# Patient Record
Sex: Male | Born: 1960
Health system: Southern US, Community
[De-identification: ages and names within clinical notes are randomized; demographics above are authoritative.]

## PROBLEM LIST (undated history)

## (undated) DIAGNOSIS — K219 Gastro-esophageal reflux disease without esophagitis: Secondary | ICD-10-CM

## (undated) DIAGNOSIS — E785 Hyperlipidemia, unspecified: Secondary | ICD-10-CM

## (undated) HISTORY — DX: Hyperlipidemia, unspecified: E78.5

## (undated) HISTORY — DX: Gastro-esophageal reflux disease without esophagitis: K21.9

---

## 2002-10-26 ENCOUNTER — Encounter: Payer: Self-pay | Admitting: Emergency Medicine

## 2002-10-26 ENCOUNTER — Emergency Department (HOSPITAL_COMMUNITY): Admission: EM | Admit: 2002-10-26 | Discharge: 2002-10-27 | Payer: Self-pay | Admitting: Emergency Medicine

## 2003-03-16 ENCOUNTER — Ambulatory Visit (HOSPITAL_COMMUNITY): Admission: RE | Admit: 2003-03-16 | Discharge: 2003-03-16 | Payer: Self-pay | Admitting: Orthopedic Surgery

## 2003-03-20 ENCOUNTER — Encounter: Payer: Self-pay | Admitting: Orthopedic Surgery

## 2003-03-20 ENCOUNTER — Ambulatory Visit (HOSPITAL_COMMUNITY): Admission: RE | Admit: 2003-03-20 | Discharge: 2003-03-20 | Payer: Self-pay | Admitting: Orthopedic Surgery

## 2012-12-20 ENCOUNTER — Encounter (INDEPENDENT_AMBULATORY_CARE_PROVIDER_SITE_OTHER): Payer: Self-pay

## 2012-12-20 ENCOUNTER — Encounter (INDEPENDENT_AMBULATORY_CARE_PROVIDER_SITE_OTHER): Payer: Self-pay | Admitting: Surgery

## 2012-12-24 ENCOUNTER — Ambulatory Visit (INDEPENDENT_AMBULATORY_CARE_PROVIDER_SITE_OTHER): Payer: Federal, State, Local not specified - PPO | Admitting: Surgery

## 2012-12-24 ENCOUNTER — Encounter (INDEPENDENT_AMBULATORY_CARE_PROVIDER_SITE_OTHER): Payer: Self-pay | Admitting: Surgery

## 2012-12-24 VITALS — BP 114/70 | HR 64 | Temp 97.1°F | Resp 12 | Ht 72.0 in | Wt 175.8 lb

## 2012-12-24 DIAGNOSIS — K4090 Unilateral inguinal hernia, without obstruction or gangrene, not specified as recurrent: Secondary | ICD-10-CM

## 2012-12-24 NOTE — Patient Instructions (Signed)

## 2012-12-24 NOTE — Progress Notes (Signed)
Patient ID: Bruce [name], male   DOB: [birth year], 52 y.o.   MRN: [id number]  No chief complaint on file.   HPI Bruce [name] is a 53 y.o. male.  Patient sent at the request of Dr. Gildardo Cranker for right inguinal hernia. This hernia was detected when he was a small child. It is not causing any significant pain at this point in time. Denies any gastrointestinal symptoms. He is very active and this has not interfered with his activity. HPI  Past Medical History  Diagnosis Date  . Hyperlipidemia   . GERD (gastroesophageal reflux disease)     History reviewed. No pertinent past surgical history.  Family History  Problem Relation Age of Onset  . Cancer Mother   . Heart disease Father     Social History History  Substance Use Topics  . Smoking status: Never Smoker   . Smokeless tobacco: Not on file  . Alcohol Use: Yes    No Known Allergies  Current Outpatient Prescriptions  Medication Sig Dispense Refill  . b complex vitamins tablet Take 1 tablet by mouth daily.      . fish oil-omega-3 fatty acids 1000 MG capsule Take 2 g by mouth daily.      . Multiple Vitamins-Minerals (MULTIVITAMIN WITH MINERALS) tablet Take 1 tablet by mouth daily.      Marland Kitchen omeprazole (PRILOSEC) 20 MG capsule Take 20 mg by mouth daily.      . pramipexole (MIRAPEX) 0.25 MG tablet Take 0.25 mg by mouth 3 (three) times daily.       No current facility-administered medications for this visit.    Review of Systems Review of Systems  Constitutional: Negative for fever, chills and unexpected weight change.  HENT: Negative for hearing loss, congestion, sore throat, trouble swallowing and voice change.   Eyes: Negative for visual disturbance.  Respiratory: Negative for cough and wheezing.   Cardiovascular: Negative for chest pain, palpitations and leg swelling.  Gastrointestinal: Negative for nausea, vomiting, abdominal pain, diarrhea, constipation, blood in stool, abdominal distention, anal bleeding and rectal  pain.  Genitourinary: Negative for hematuria and difficulty urinating.  Musculoskeletal: Negative for arthralgias.  Skin: Negative for rash and wound.  Neurological: Negative for seizures, syncope, weakness and headaches.  Hematological: Negative for adenopathy. Does not bruise/bleed easily.  Psychiatric/Behavioral: Negative for confusion.    Blood pressure 114/70, pulse 64, temperature 97.1 F (36.2 C), resp. rate 12, height 6' (1.829 m), weight 175 lb 12.8 oz (79.742 kg).  Physical Exam Physical Exam  Constitutional: He is oriented to person, place, and time. He appears well-developed and well-nourished.  HENT:  Head: Normocephalic and atraumatic.  Eyes: Conjunctivae and EOM are normal. Pupils are equal, round, and reactive to light.  Neck: Neck supple.  Pulmonary/Chest: Effort normal.  Abdominal: Soft. Normal appearance. There is no tenderness. A hernia is present. Hernia confirmed positive in the right inguinal area. Hernia confirmed negative in the ventral area and confirmed negative in the left inguinal area.  Musculoskeletal: Normal range of motion.  Neurological: He is alert and oriented to person, place, and time.  Skin: Skin is warm and dry.  Psychiatric: He has a normal mood and affect. His behavior is normal. Judgment and thought content normal.      Assessment    Right inguinal hernia asymptomatic reducible    Plan    Discussed options of repair versus observation. He is quite active and is having minimal symptoms from this currently. He has had this for at  least 30 years by history. He would return later this year to scheduled at a later time. Information given.       Kizzie Cotten A. 12/24/2012, 3:27 PM

## 2013-04-01 ENCOUNTER — Encounter: Payer: Self-pay | Admitting: Family Medicine

## 2013-10-15 ENCOUNTER — Encounter: Payer: Self-pay | Admitting: Family Medicine

## 2013-10-15 ENCOUNTER — Encounter (INDEPENDENT_AMBULATORY_CARE_PROVIDER_SITE_OTHER): Payer: Self-pay

## 2013-10-15 ENCOUNTER — Ambulatory Visit (INDEPENDENT_AMBULATORY_CARE_PROVIDER_SITE_OTHER): Payer: BC Managed Care – PPO | Admitting: Family Medicine

## 2013-10-15 VITALS — BP 135/88 | HR 50 | Ht 72.0 in | Wt 175.0 lb

## 2013-10-15 DIAGNOSIS — M25511 Pain in right shoulder: Secondary | ICD-10-CM

## 2013-10-15 DIAGNOSIS — M25519 Pain in unspecified shoulder: Secondary | ICD-10-CM

## 2013-10-15 NOTE — Patient Instructions (Signed)
You have biceps tendinopathy. Consider formal physical therapy. Start home exercises - arm curls, supination/pronation (with hammer or weight) 3 sets of 10 once a day for next 6 weeks. Theraband exercises 3 sets of 10 once a day also for rotator cuff. Icing as needed 15 minutes at a time. Ibuprofen 600mg  three times a day with food OR aleve 2 tabs twice a day with food for pain and inflammation. Follow up with me in 6 weeks or as needed.

## 2013-10-20 ENCOUNTER — Encounter: Payer: Self-pay | Admitting: Family Medicine

## 2013-10-20 DIAGNOSIS — M25511 Pain in right shoulder: Secondary | ICD-10-CM | POA: Insufficient documentation

## 2013-10-20 NOTE — Assessment & Plan Note (Signed)
proximal biceps tendinopathy.  Start home exercise program, theraband exercises.  Icing, nsaids.  Consider formal physical therapy if not improving.  F/u in 6 weeks.

## 2013-10-20 NOTE — Progress Notes (Signed)
Patient ID: Bruce [name], male   DOB: Oct 16, 1960, 53 y.o.   MRN: [id number]  PCP:  Duane Lopeoss, Alan, MD  Subjective:   HPI: Patient is a 53 y.o. male here for right shoulder pain.  Patient denies known injury. Reports he works out at [employer] weightlifting. Also plays tennis. Some pain with overhead motions and when playing tennis. Forward pressing causes anterior shoulder pain. No swelling, bruising. Not currently taking or doing anything for this other than relative rest.  Past Medical History  Diagnosis Date  . Hyperlipidemia   . GERD (gastroesophageal reflux disease)     Current Outpatient Prescriptions on File Prior to Visit  Medication Sig Dispense Refill  . b complex vitamins tablet Take 1 tablet by mouth daily.      . fish oil-omega-3 fatty acids 1000 MG capsule Take 2 g by mouth daily.      . Multiple Vitamins-Minerals (MULTIVITAMIN WITH MINERALS) tablet Take 1 tablet by mouth daily.      Marland Kitchen. omeprazole (PRILOSEC) 20 MG capsule Take 20 mg by mouth daily.      . pramipexole (MIRAPEX) 0.25 MG tablet Take 0.25 mg by mouth 3 (three) times daily.       No current facility-administered medications on file prior to visit.    History reviewed. No pertinent past surgical history.  No Known Allergies  History   Social History  . Marital Status: Married    Spouse Name: N/A    Number of Children: N/A  . Years of Education: N/A   Occupational History  . Not on file.   Social History Main Topics  . Smoking status: Never Smoker   . Smokeless tobacco: Not on file  . Alcohol Use: Yes  . Drug Use: No  . Sexual Activity: Not on file   Other Topics Concern  . Not on file   Social History Narrative  . No narrative on file    Family History  Problem Relation Age of Onset  . Cancer Mother   . Heart disease Father     BP 135/88  Pulse 50  Ht 6' (1.829 m)  Wt 175 lb (79.379 kg)  BMI 23.73 kg/m2  Review of Systems: See HPI above.    Objective:  Physical Exam:  Gen:  NAD  Right shoulder: No swelling, ecchymoses.  No gross deformity. TTP prox biceps tendon.  No other tenderness. FROM with negative painful arc. Negative Hawkins, Neers. Mildly positive Yergasons. Strength 5/5 with empty can and resisted internal/external rotation. Negative apprehension. NV intact distally.    Assessment & Plan:  1. Right shoulder pain - proximal biceps tendinopathy.  Start home exercise program, theraband exercises.  Icing, nsaids.  Consider formal physical therapy if not improving.  F/u in 6 weeks.

## 2016-01-05 ENCOUNTER — Other Ambulatory Visit: Payer: Self-pay | Admitting: Physical Medicine and Rehabilitation

## 2016-01-05 DIAGNOSIS — M545 Low back pain: Secondary | ICD-10-CM

## 2016-01-10 ENCOUNTER — Ambulatory Visit
Admission: RE | Admit: 2016-01-10 | Discharge: 2016-01-10 | Disposition: A | Discharge status: Home / Self Care | Payer: Federal, State, Local not specified - PPO | Source: Ambulatory Visit | Attending: Physical Medicine and Rehabilitation | Admitting: Physical Medicine and Rehabilitation

## 2016-01-10 DIAGNOSIS — M545 Low back pain: Secondary | ICD-10-CM

## 2016-07-07 DIAGNOSIS — E782 Mixed hyperlipidemia: Secondary | ICD-10-CM | POA: Diagnosis not present

## 2016-07-07 DIAGNOSIS — M2550 Pain in unspecified joint: Secondary | ICD-10-CM | POA: Diagnosis not present

## 2016-07-22 DIAGNOSIS — E782 Mixed hyperlipidemia: Secondary | ICD-10-CM | POA: Diagnosis not present

## 2016-07-22 DIAGNOSIS — M2550 Pain in unspecified joint: Secondary | ICD-10-CM | POA: Diagnosis not present

## 2016-08-19 DIAGNOSIS — Z86010 Personal history of colonic polyps: Secondary | ICD-10-CM | POA: Diagnosis not present

## 2016-08-19 DIAGNOSIS — R197 Diarrhea, unspecified: Secondary | ICD-10-CM | POA: Diagnosis not present

## 2016-08-19 DIAGNOSIS — K219 Gastro-esophageal reflux disease without esophagitis: Secondary | ICD-10-CM | POA: Diagnosis not present

## 2016-08-22 DIAGNOSIS — R768 Other specified abnormal immunological findings in serum: Secondary | ICD-10-CM | POA: Diagnosis not present

## 2016-08-22 DIAGNOSIS — M2550 Pain in unspecified joint: Secondary | ICD-10-CM | POA: Diagnosis not present

## 2016-09-27 DIAGNOSIS — R197 Diarrhea, unspecified: Secondary | ICD-10-CM | POA: Diagnosis not present

## 2016-09-27 DIAGNOSIS — K228 Other specified diseases of esophagus: Secondary | ICD-10-CM | POA: Diagnosis not present

## 2016-09-27 DIAGNOSIS — K5730 Diverticulosis of large intestine without perforation or abscess without bleeding: Secondary | ICD-10-CM | POA: Diagnosis not present

## 2016-09-27 DIAGNOSIS — R194 Change in bowel habit: Secondary | ICD-10-CM | POA: Diagnosis not present

## 2016-09-27 DIAGNOSIS — Z86010 Personal history of colonic polyps: Secondary | ICD-10-CM | POA: Diagnosis not present

## 2016-09-27 DIAGNOSIS — K449 Diaphragmatic hernia without obstruction or gangrene: Secondary | ICD-10-CM | POA: Diagnosis not present

## 2016-09-27 DIAGNOSIS — Z8371 Family history of colonic polyps: Secondary | ICD-10-CM | POA: Diagnosis not present

## 2016-09-27 DIAGNOSIS — K219 Gastro-esophageal reflux disease without esophagitis: Secondary | ICD-10-CM | POA: Diagnosis not present

## 2016-09-29 DIAGNOSIS — K219 Gastro-esophageal reflux disease without esophagitis: Secondary | ICD-10-CM | POA: Diagnosis not present

## 2016-09-30 DIAGNOSIS — K080 Exfoliation of teeth due to systemic causes: Secondary | ICD-10-CM | POA: Diagnosis not present

## 2017-02-22 DIAGNOSIS — M67911 Unspecified disorder of synovium and tendon, right shoulder: Secondary | ICD-10-CM | POA: Diagnosis not present

## 2017-03-02 DIAGNOSIS — M25511 Pain in right shoulder: Secondary | ICD-10-CM | POA: Diagnosis not present

## 2017-03-06 DIAGNOSIS — H43813 Vitreous degeneration, bilateral: Secondary | ICD-10-CM | POA: Diagnosis not present

## 2017-03-16 DIAGNOSIS — K080 Exfoliation of teeth due to systemic causes: Secondary | ICD-10-CM | POA: Diagnosis not present

## 2017-06-01 DIAGNOSIS — M25511 Pain in right shoulder: Secondary | ICD-10-CM | POA: Diagnosis not present

## 2017-06-16 DIAGNOSIS — K080 Exfoliation of teeth due to systemic causes: Secondary | ICD-10-CM | POA: Diagnosis not present

## 2017-07-12 DIAGNOSIS — H6980 Other specified disorders of Eustachian tube, unspecified ear: Secondary | ICD-10-CM | POA: Diagnosis not present

## 2017-07-12 DIAGNOSIS — Z23 Encounter for immunization: Secondary | ICD-10-CM | POA: Diagnosis not present

## 2017-07-12 DIAGNOSIS — R42 Dizziness and giddiness: Secondary | ICD-10-CM | POA: Diagnosis not present

## 2017-07-17 DIAGNOSIS — K080 Exfoliation of teeth due to systemic causes: Secondary | ICD-10-CM | POA: Diagnosis not present

## 2017-08-04 DIAGNOSIS — Z125 Encounter for screening for malignant neoplasm of prostate: Secondary | ICD-10-CM | POA: Diagnosis not present

## 2017-08-04 DIAGNOSIS — Z13220 Encounter for screening for lipoid disorders: Secondary | ICD-10-CM | POA: Diagnosis not present

## 2017-08-04 DIAGNOSIS — Z23 Encounter for immunization: Secondary | ICD-10-CM | POA: Diagnosis not present

## 2017-08-04 DIAGNOSIS — Z0000 Encounter for general adult medical examination without abnormal findings: Secondary | ICD-10-CM | POA: Diagnosis not present

## 2017-11-22 DIAGNOSIS — K080 Exfoliation of teeth due to systemic causes: Secondary | ICD-10-CM | POA: Diagnosis not present

## 2017-11-24 DIAGNOSIS — R42 Dizziness and giddiness: Secondary | ICD-10-CM | POA: Diagnosis not present

## 2017-12-05 DIAGNOSIS — H8142 Vertigo of central origin, left ear: Secondary | ICD-10-CM | POA: Diagnosis not present

## 2017-12-05 DIAGNOSIS — H8110 Benign paroxysmal vertigo, unspecified ear: Secondary | ICD-10-CM | POA: Diagnosis not present

## 2017-12-19 DIAGNOSIS — J209 Acute bronchitis, unspecified: Secondary | ICD-10-CM | POA: Diagnosis not present

## 2018-01-10 DIAGNOSIS — R42 Dizziness and giddiness: Secondary | ICD-10-CM | POA: Diagnosis not present

## 2018-01-24 DIAGNOSIS — M67911 Unspecified disorder of synovium and tendon, right shoulder: Secondary | ICD-10-CM | POA: Diagnosis not present

## 2018-03-28 DIAGNOSIS — K080 Exfoliation of teeth due to systemic causes: Secondary | ICD-10-CM | POA: Diagnosis not present

## 2018-05-07 DIAGNOSIS — K080 Exfoliation of teeth due to systemic causes: Secondary | ICD-10-CM | POA: Diagnosis not present

## 2018-06-27 ENCOUNTER — Encounter: Payer: Self-pay | Admitting: Family Medicine

## 2018-06-27 ENCOUNTER — Ambulatory Visit: Payer: Federal, State, Local not specified - PPO | Admitting: Family Medicine

## 2018-06-27 VITALS — BP 154/80 | Ht 72.0 in | Wt 180.0 lb

## 2018-06-27 DIAGNOSIS — M25552 Pain in left hip: Secondary | ICD-10-CM | POA: Diagnosis not present

## 2018-06-27 NOTE — Patient Instructions (Signed)
Your pain is due to mild arthritis of your hip. You may have had muscular overuse strains in hamstring, quads too but your exam for this is normal now. These are the different medications you can take for this: Tylenol 500mg  1-2 tabs three times a day for pain. Capsaicin, aspercreme, or biofreeze topically up to four times a day may also help with pain. Some supplements that may help for arthritis: Boswellia extract, curcumin, pycnogenol Aleve 1-2 tabs twice a day with food - usually if you take, take 7-10 days then as needed. Cortisone injections are an option if pain is severe. It's important that you continue to stay active but avoid stretches we talked about like the pretzel stretch, the quad stretch on the chair for the next 4 weeks. Focus on strengthening with Straight leg raises, knee extensions, hip side raises, hamstring curls 3 sets of 10 once a day (add ankle weight if these become too easy). Consider physical therapy to strengthen muscles around the joint that hurts to take pressure off of the joint itself. Heat or ice 15 minutes at a time 3-4 times a day as needed to help with pain if needed. Swimming, cycling with low resistance are the best two types of exercise for arthritis though any exercise is ok as long as it doesn't worsen the pain. Follow up with me in 4 weeks.

## 2018-06-28 ENCOUNTER — Encounter: Payer: Self-pay | Admitting: Family Medicine

## 2018-06-28 DIAGNOSIS — H8112 Benign paroxysmal vertigo, left ear: Secondary | ICD-10-CM | POA: Insufficient documentation

## 2018-06-28 NOTE — Progress Notes (Signed)
PCP: Bruce Floro, MD  Subjective:   HPI: Patient is a 57 y.o. male here for left leg pain.  Patient is very active - plays hockey and exercises regularly. He recalls last Monday following playing golf, hockey, working out he developed pain that was fairly severe left hamstring. No acute injury or trauma. Pain has continued, worse later in the day and can involved his quad and most recently anterior hip, a tightness though hamstring was throbbing that first day. Has history of back problems with disc. Recalls recently had some pain outside left lower leg. Has tried stretching but no medications or anything else for this. No skin changes, numbness. No bowel/bladder dysfunction.  Past Medical History:  Diagnosis Date  . GERD (gastroesophageal reflux disease)   . Hyperlipidemia     Current Outpatient Medications on File Prior to Visit  Medication Sig Dispense Refill  . b complex vitamins tablet Take 1 tablet by mouth daily.    . fish oil-omega-3 fatty acids 1000 MG capsule Take 2 g by mouth daily.    . Multiple Vitamins-Minerals (MULTIVITAMIN WITH MINERALS) tablet Take 1 tablet by mouth daily.    Marland Kitchen omeprazole (PRILOSEC) 20 MG capsule Take 20 mg by mouth daily.    . pramipexole (MIRAPEX) 0.25 MG tablet Take 0.25 mg by mouth 3 (three) times daily.     No current facility-administered medications on file prior to visit.     History reviewed. No pertinent surgical history.  No Known Allergies  Social History   Socioeconomic History  . Marital status: Married    Spouse name: Not on file  . Number of children: Not on file  . Years of education: Not on file  . Highest education level: Not on file  Occupational History  . Not on file  Social Needs  . Financial resource strain: Not on file  . Food insecurity:    Worry: Not on file    Inability: Not on file  . Transportation needs:    Medical: Not on file    Non-medical: Not on file  Tobacco Use  . Smoking status:  Never Smoker  . Smokeless tobacco: Never Used  Substance and Sexual Activity  . Alcohol use: Yes  . Drug use: No  . Sexual activity: Not on file  Lifestyle  . Physical activity:    Days per week: Not on file    Minutes per session: Not on file  . Stress: Not on file  Relationships  . Social connections:    Talks on phone: Not on file    Gets together: Not on file    Attends religious service: Not on file    Active member of club or organization: Not on file    Attends meetings of clubs or organizations: Not on file    Relationship status: Not on file  . Intimate partner violence:    Fear of current or ex partner: Not on file    Emotionally abused: Not on file    Physically abused: Not on file    Forced sexual activity: Not on file  Other Topics Concern  . Not on file  Social History Narrative  . Not on file    Family History  Problem Relation Age of Onset  . Cancer Mother   . Heart disease Father     BP (!) 154/80   Ht 6' (1.829 m)   Wt 180 lb (81.6 kg)   BMI 24.41 kg/m   Review of Systems: See HPI  above.     Objective:  Physical Exam:  Gen: NAD, comfortable in exam room  Back: No gross deformity, scoliosis. No paraspinal TTP .  No midline or bony TTP. FROM. Strength LEs 5/5 all muscle groups.   2+ MSRs in patellar and achilles tendons, equal bilaterally. Negative SLRs. Sensation intact to light touch bilaterally.  Left hip: No deformity. FROM with 5/5 strength, minimal pain with hip flexion.  No pain knee flexion and extension. No tenderness to palpation. NVI distally. Positive logroll. Negative fabers and piriformis stretches.  Assessment & Plan:  1. Left hip pain - Only positive on patient's exam is pain with logroll, minimal pain with resisted hip flexion.  Suggestive of mild arthritis flare of his hip as primary issue.  Discussed tylenol topical medications, supplements, aleve.  Consider physical therapy - shown home exercises to start daily.   Heat or ice.  Consider injection if pain worsens, radiographs.  F/u in 4 weeks.

## 2018-07-25 ENCOUNTER — Ambulatory Visit: Payer: Federal, State, Local not specified - PPO | Admitting: Family Medicine

## 2018-07-25 ENCOUNTER — Encounter: Payer: Self-pay | Admitting: Family Medicine

## 2018-07-25 VITALS — BP 126/86 | Ht 72.0 in | Wt 182.0 lb

## 2018-07-25 DIAGNOSIS — M25552 Pain in left hip: Secondary | ICD-10-CM | POA: Diagnosis not present

## 2018-07-25 NOTE — Patient Instructions (Addendum)
Continue your strengthening exercises and avoid the extremes of stretching as you have been. Activities, sports as tolerated. These are the different medications you can take for this: Tylenol 500mg  1-2 tabs three times a day for pain. Capsaicin, aspercreme, or biofreeze topically up to four times a day may also help with pain. Some supplements that may help for arthritis: Boswellia extract, curcumin, pycnogenol Aleve 1-2 tabs twice a day with food only if needed. Cortisone injections are an option if pain is severe. Consider physical therapy to strengthen muscles around the joint that hurts to take pressure off of the joint itself. Heat or ice 15 minutes at a time 3-4 times a day as needed to help with pain if needed. Follow up with me as needed if you continue to improve and pain is not severe enough to warrant injection, further evaluation of the hip joint

## 2018-07-26 ENCOUNTER — Encounter: Payer: Self-pay | Admitting: Family Medicine

## 2018-07-26 NOTE — Progress Notes (Signed)
PCP: Bruce Floro, MD  Subjective:   HPI: Patient is a 57 y.o. male here for left leg pain.  10/9: Patient is very active - plays hockey and exercises regularly. He recalls last Monday following playing golf, hockey, working out he developed pain that was fairly severe left hamstring. No acute injury or trauma. Pain has continued, worse later in the day and can involved his quad and most recently anterior hip, a tightness though hamstring was throbbing that first day. Has history of back problems with disc. Recalls recently had some pain outside left lower leg. Has tried stretching but no medications or anything else for this. No skin changes, numbness. No bowel/bladder dysfunction.  11/6: Patient reports he feels better compared to last visit. Has been doing home exercises. Worse with getting up from prolonged sitting. Some pain posterolateral thigh/hamstring area. Able to play hockey still and feels better after warming up. No skin changes, numbness.  Past Medical History:  Diagnosis Date  . GERD (gastroesophageal reflux disease)   . Hyperlipidemia     Current Outpatient Medications on File Prior to Visit  Medication Sig Dispense Refill  . b complex vitamins tablet Take 1 tablet by mouth daily.    . fish oil-omega-3 fatty acids 1000 MG capsule Take 2 g by mouth daily.    . Multiple Vitamins-Minerals (MULTIVITAMIN WITH MINERALS) tablet Take 1 tablet by mouth daily.    Marland Kitchen omeprazole (PRILOSEC) 20 MG capsule Take 20 mg by mouth daily.    . pramipexole (MIRAPEX) 0.25 MG tablet Take 0.25 mg by mouth 3 (three) times daily.     No current facility-administered medications on file prior to visit.     History reviewed. No pertinent surgical history.  No Known Allergies  Social History   Socioeconomic History  . Marital status: Married    Spouse name: Not on file  . Number of children: Not on file  . Years of education: Not on file  . Highest education level: Not on  file  Occupational History  . Not on file  Social Needs  . Financial resource strain: Not on file  . Food insecurity:    Worry: Not on file    Inability: Not on file  . Transportation needs:    Medical: Not on file    Non-medical: Not on file  Tobacco Use  . Smoking status: Never Smoker  . Smokeless tobacco: Never Used  Substance and Sexual Activity  . Alcohol use: Yes  . Drug use: No  . Sexual activity: Not on file  Lifestyle  . Physical activity:    Days per week: Not on file    Minutes per session: Not on file  . Stress: Not on file  Relationships  . Social connections:    Talks on phone: Not on file    Gets together: Not on file    Attends religious service: Not on file    Active member of club or organization: Not on file    Attends meetings of clubs or organizations: Not on file    Relationship status: Not on file  . Intimate partner violence:    Fear of current or ex partner: Not on file    Emotionally abused: Not on file    Physically abused: Not on file    Forced sexual activity: Not on file  Other Topics Concern  . Not on file  Social History Narrative  . Not on file    Family History  Problem Relation Age  of Onset  . Cancer Mother   . Heart disease Father     BP 126/86   Ht 6' (1.829 m)   Wt 182 lb (82.6 kg)   BMI 24.68 kg/m   Review of Systems: See HPI above.     Objective:  Physical Exam:  Gen: NAD, comfortable in exam room  Back: No gross deformity, scoliosis. No paraspinal TTP .  No midline or bony TTP. FROM. Strength LEs 5/5 all muscle groups.   2+ MSRs in patellar and achilles tendons, equal bilaterally. Negative SLRs. Sensation intact to light touch bilaterally.  Left hip: No deformity. FROM with 5/5 strength, minimal pain hip flexion. No tenderness to palpation. NVI distally. Mild pain with logroll Negative fabers and piriformis stretches.  Assessment & Plan:  1. Left hip pain - Patient's exam is similar to last visit  though symptomatically he has improved.  Still suggestive of mild arthritis though location of pain is unusual.  Tylenol, topical medications, supplements, aleve if needed.  Consider physical therapy, trial of intraarticular injection if he doesn't continue to improve.  Continue home exercises.  F/u prn if he's doing well.

## 2018-09-05 ENCOUNTER — Ambulatory Visit
Admission: RE | Admit: 2018-09-05 | Discharge: 2018-09-05 | Disposition: A | Discharge status: Home / Self Care | Payer: Federal, State, Local not specified - PPO | Source: Ambulatory Visit | Attending: Family Medicine | Admitting: Family Medicine

## 2018-09-05 ENCOUNTER — Ambulatory Visit: Payer: Federal, State, Local not specified - PPO | Admitting: Family Medicine

## 2018-09-05 ENCOUNTER — Encounter: Payer: Self-pay | Admitting: Family Medicine

## 2018-09-05 VITALS — BP 142/78 | Ht 72.0 in | Wt 180.0 lb

## 2018-09-05 DIAGNOSIS — M25552 Pain in left hip: Secondary | ICD-10-CM

## 2018-09-05 NOTE — Patient Instructions (Signed)
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 We will go ahead with an MRI to further assess given you're not improving with conservative measures. I will contact you with the results and next steps.

## 2018-09-06 ENCOUNTER — Telehealth: Payer: Self-pay | Admitting: Family Medicine

## 2018-09-06 ENCOUNTER — Encounter: Payer: Self-pay | Admitting: Family Medicine

## 2018-09-06 MED ORDER — DIAZEPAM 5 MG PO TABS: ORAL_TABLET | ORAL | 0 refills | Status: DC

## 2018-09-06 NOTE — Telephone Encounter (Signed)
Sent valium in for him.

## 2018-09-06 NOTE — Telephone Encounter (Signed)
-----   Message from Lizbeth BarkMelanie L Ceresi sent at 09/06/2018  1:33 PM EST ----- Regarding: phone message Pt was unable to complete MRI due to being claustrophobic. He was told to ask you for valium and  reschedule his MRI.  Pharmacy is [facility] [address] [place]

## 2018-09-06 NOTE — Progress Notes (Signed)
PCP: Daisy Florooss, Charles Alan, MD  Subjective:   HPI: Patient is a 57 y.o. male here for left leg pain.  10/9: Patient is very active - plays hockey and exercises regularly. He recalls last Monday following playing golf, hockey, working out he developed pain that was fairly severe left hamstring. No acute injury or trauma. Pain has continued, worse later in the day and can involved his quad and most recently anterior hip, a tightness though hamstring was throbbing that first day. Has history of back problems with disc. Recalls recently had some pain outside left lower leg. Has tried stretching but no medications or anything else for this. No skin changes, numbness. No bowel/bladder dysfunction.  11/6: Patient reports he feels better compared to last visit. Has been doing home exercises. Worse with getting up from prolonged sitting. Some pain posterolateral thigh/hamstring area. Able to play hockey still and feels better after warming up. No skin changes, numbness.  12/18: Patient returns reporting about 2 weeks of worsening pain in the posterior aspect of the left hip, hamstring, gluteal area. Pain worse when he is up on his feet and not bothering him even with walking. He was taking Aleve initially but did not notice much benefit and stopped. No numbness or tingling. Occasional gets a twinge in the lower back but nothing consistent.  Past Medical History:  Diagnosis Date  . GERD (gastroesophageal reflux disease)   . Hyperlipidemia     Current Outpatient Medications on File Prior to Visit  Medication Sig Dispense Refill  . b complex vitamins tablet Take 1 tablet by mouth daily.    . fish oil-omega-3 fatty acids 1000 MG capsule Take 2 g by mouth daily.    . Multiple Vitamins-Minerals (MULTIVITAMIN WITH MINERALS) tablet Take 1 tablet by mouth daily.    Marland Kitchen. omeprazole (PRILOSEC) 20 MG capsule Take 20 mg by mouth daily.    . pramipexole (MIRAPEX) 0.25 MG tablet Take 0.25 mg by mouth  3 (three) times daily.     No current facility-administered medications on file prior to visit.     History reviewed. No pertinent surgical history.  No Known Allergies  Social History   Socioeconomic History  . Marital status: Married    Spouse name: Not on file  . Number of children: Not on file  . Years of education: Not on file  . Highest education level: Not on file  Occupational History  . Not on file  Social Needs  . Financial resource strain: Not on file  . Food insecurity:    Worry: Not on file    Inability: Not on file  . Transportation needs:    Medical: Not on file    Non-medical: Not on file  Tobacco Use  . Smoking status: Never Smoker  . Smokeless tobacco: Never Used  Substance and Sexual Activity  . Alcohol use: Yes  . Drug use: No  . Sexual activity: Not on file  Lifestyle  . Physical activity:    Days per week: Not on file    Minutes per session: Not on file  . Stress: Not on file  Relationships  . Social connections:    Talks on phone: Not on file    Gets together: Not on file    Attends religious service: Not on file    Active member of club or organization: Not on file    Attends meetings of clubs or organizations: Not on file    Relationship status: Not on file  . Intimate  partner violence:    Fear of current or ex partner: Not on file    Emotionally abused: Not on file    Physically abused: Not on file    Forced sexual activity: Not on file  Other Topics Concern  . Not on file  Social History Narrative  . Not on file    Family History  Problem Relation Age of Onset  . Cancer Mother   . Heart disease Father     BP (!) 142/78   Ht 6' (1.829 m)   Wt 180 lb (81.6 kg)   BMI 24.41 kg/m   Review of Systems: See HPI above.     Objective:  Physical Exam:  Gen: NAD, comfortable in exam room  Back: No gross deformity, scoliosis. No paraspinal TTP .  No midline or bony TTP. FROM without pain. Strength LEs 5/5 all muscle  groups except 5-/5 with cramping on knee flexion at 30 degrees on left.   2+ MSRs in patellar and achilles tendons, equal bilaterally. Negative SLRs. Sensation intact to light touch bilaterally.  Left hip: No deformity. FROM with 5/5 strength. TTP proximal hamstring and over proximal IT band.  No other tenderness. NVI distally. Mild pain logroll. Negative fabers and piriformis stretches.  Assessment & Plan:  1. Left hip pain - worsening recently.  Went ahead with radiographs - independently reviewed and noted mild arthropathy with cam deformity consistent with impingement.  Secondary proximal hamstring strain, IT band syndrome as well.  Will go ahead with MRI given lack of improvement to conservative measures including aleve, home exercise program.

## 2018-09-15 ENCOUNTER — Ambulatory Visit
Admission: RE | Admit: 2018-09-15 | Discharge: 2018-09-15 | Disposition: A | Discharge status: Home / Self Care | Payer: Federal, State, Local not specified - PPO | Source: Ambulatory Visit | Attending: Family Medicine | Admitting: Family Medicine

## 2018-09-15 DIAGNOSIS — M25552 Pain in left hip: Secondary | ICD-10-CM | POA: Diagnosis not present

## 2018-09-24 DIAGNOSIS — M25552 Pain in left hip: Secondary | ICD-10-CM | POA: Diagnosis not present

## 2018-09-27 DIAGNOSIS — M25552 Pain in left hip: Secondary | ICD-10-CM | POA: Diagnosis not present

## 2018-10-01 DIAGNOSIS — M25552 Pain in left hip: Secondary | ICD-10-CM | POA: Diagnosis not present

## 2018-10-03 DIAGNOSIS — K080 Exfoliation of teeth due to systemic causes: Secondary | ICD-10-CM | POA: Diagnosis not present

## 2018-10-04 DIAGNOSIS — M25552 Pain in left hip: Secondary | ICD-10-CM | POA: Diagnosis not present

## 2018-10-22 DIAGNOSIS — M25552 Pain in left hip: Secondary | ICD-10-CM | POA: Diagnosis not present

## 2018-11-09 DIAGNOSIS — L821 Other seborrheic keratosis: Secondary | ICD-10-CM | POA: Diagnosis not present

## 2018-11-09 DIAGNOSIS — L570 Actinic keratosis: Secondary | ICD-10-CM | POA: Diagnosis not present

## 2019-02-06 DIAGNOSIS — Z0000 Encounter for general adult medical examination without abnormal findings: Secondary | ICD-10-CM | POA: Diagnosis not present

## 2019-02-08 ENCOUNTER — Telehealth: Payer: Self-pay | Admitting: *Deleted

## 2019-02-08 NOTE — Telephone Encounter (Signed)
REFERRAL SENT TO SCHEDULING AND NOTES ON FILE FROM DR. ALAN ROSS [phone]. °

## 2019-02-27 NOTE — Progress Notes (Signed)
Cardiology Office Note   Date:  02/28/2019   ID:  ELMA LIMAS, DOB 20-May-1961, MRN [id number]  PCP:  Lawerance Cruel, MD  Cardiologist:   No primary care provider on file. Referring:  Lawerance Cruel, MD  No chief complaint on file.     History of Present Illness: Bruce [name] is a 58 y.o. male who was referred by Lawerance Cruel, MD for evaluation of SOB.  The patient has no past cardiac history. However, he has risk factors with dyslipidemia and a family history.  He has been athletic and might get SOB with significant activity.  The patient denies any neck or arm discomfort. There has been no PND or orthopnea. There have been no reported palpitations, presyncope or syncope.   He rarely gets some fleeting chest pain.  This is up at the top of the chest.  It comes and goes spontaneously.  He does not have associated symptoms.  When the pandemic allows he likes to play ice hockey.  He also is doing some Trail running  Past Medical History:  Diagnosis Date  . GERD (gastroesophageal reflux disease)   . Hyperlipidemia     No past surgical history on file.   Current Outpatient Medications  Medication Sig Dispense Refill  . omeprazole (PRILOSEC) 20 MG capsule Take 20 mg by mouth daily.     No current facility-administered medications for this visit.     Allergies:   Patient has no known allergies.    Social History:  The patient  reports that he has never smoked. He has never used smokeless tobacco. He reports current alcohol use. He reports that he does not use drugs.   Family History:  The patient's family history includes Cancer in his mother; Heart disease in his father. His paternal grandparents had early onset CAD and he had a first cousin with MI and sudden death at age 7.   ROS:  Please see the history of present illness.   Otherwise, review of systems are positive for none.   All other systems are reviewed and negative.    PHYSICAL EXAM: VS:  BP (!)  157/94   Pulse (!) 48   Temp 97.8 F (36.6 C)   Ht 6' (1.829 m)   Wt 185 lb 12.8 oz (84.3 kg)   SpO2 97%   BMI 25.20 kg/m  , BMI Body mass index is 25.2 kg/m. GENERAL:  Well appearing HEENT:  Pupils equal round and reactive, fundi not visualized, oral mucosa unremarkable NECK:  No jugular venous distention, waveform within normal limits, carotid upstroke brisk and symmetric, no bruits, no thyromegaly LYMPHATICS:  No cervical, inguinal adenopathy LUNGS:  Clear to auscultation bilaterally BACK:  No CVA tenderness CHEST:  Unremarkable HEART:  PMI not displaced or sustained,S1 and S2 within normal limits, no S3, no S4, no clicks, no rubs, no murmurs ABD:  Flat, positive bowel sounds normal in frequency in pitch, no bruits, no rebound, no guarding, no midline pulsatile mass, no hepatomegaly, no splenomegaly EXT:  2 plus pulses throughout, no edema, no cyanosis no clubbing SKIN:  No rashes no nodules NEURO:  Cranial nerves II through XII grossly intact, motor grossly intact throughout PSYCH:  Cognitively intact, oriented to person place and time   EKG:  EKG is ordered today. The ekg ordered today demonstrates sinus bradycardia, rate 48, axis within normal limits, intervals within normal limits, RSR prime V1 and V2.,  No acute ectopy.    Recent  Labs: No results found for requested labs within last 8760 hours.    Lipid Panel No results found for: CHOL, TRIG, HDL, CHOLHDL, VLDL, LDLCALC, LDLDIRECT    Wt Readings from Last 3 Encounters:  02/28/19 185 lb 12.8 oz (84.3 kg)  09/05/18 180 lb (81.6 kg)  07/25/18 182 lb (82.6 kg)      Other studies Reviewed: Additional studies/ records that were reviewed today include: Labs. Review of the above records demonstrates:  Please see elsewhere in the note.     ASSESSMENT AND PLAN:  SOB:    Given the risk factors he needs to be screened for obstructive CAD.  I will start with a coronary calcium score.  Further evaluation will be based on  this result.  He and I talked abut this at length.  We talked about risk reduction as below.   HTN:   His BP is elevated today but he says that this is unusual.  I have asked him to keep a BP diary and discussed goals.  He might ultimately need a BP med.    DYSLIPIDEMIA:  LDL was 191.  HDL was 68.  He has wanted to avoid meds but targets will be based on the presence or absence of disease with the calcium score and other possible testing going forward.  We did discuss diet and we will have further conversations.     Current medicines are reviewed at length with the patient today.  The patient does not have concerns regarding medicines.  The following changes have been made:  no change  Labs/ tests ordered today include:   Orders Placed This Encounter  Procedures  . CT CARDIAC SCORING  . EKG 12-Lead     Disposition:   FU with me based on the results of the above.     Signed, Rollene RotundaJames Soledad Budreau, MD  02/28/2019 5:13 PM    [facility]

## 2019-02-28 ENCOUNTER — Ambulatory Visit: Payer: Federal, State, Local not specified - PPO | Admitting: Cardiology

## 2019-02-28 ENCOUNTER — Other Ambulatory Visit: Payer: Self-pay

## 2019-02-28 ENCOUNTER — Encounter: Payer: Self-pay | Admitting: Cardiology

## 2019-02-28 VITALS — BP 157/94 | HR 48 | Temp 97.8°F | Ht 72.0 in | Wt 185.8 lb

## 2019-02-28 DIAGNOSIS — R0602 Shortness of breath: Secondary | ICD-10-CM | POA: Diagnosis not present

## 2019-02-28 NOTE — Patient Instructions (Signed)
Medication Instructions:  Continue current medications  If you need a refill on your cardiac medications before your next appointment, please call your pharmacy.  Labwork: None Ordered   Testing/Procedures: Your physician has requested that you have a Coronary Calcium Score. This test is done at our [place]: . Your physician recommends that you schedule a follow-up appointment in: As Needed   At [facility], you and your health needs are our priority.  As part of our continuing mission to provide you with exceptional heart care, we have created designated Provider Care Teams.  These Care Teams include your primary Cardiologist (physician) and Advanced Practice Providers (APPs -  Physician Assistants and Nurse Practitioners) who all work together to provide you with the care you need, when you need it.  Thank you for choosing [facility] at [facility]!!

## 2019-04-08 ENCOUNTER — Encounter (INDEPENDENT_AMBULATORY_CARE_PROVIDER_SITE_OTHER): Payer: Self-pay

## 2019-04-08 ENCOUNTER — Other Ambulatory Visit: Payer: Self-pay

## 2019-04-08 ENCOUNTER — Ambulatory Visit (INDEPENDENT_AMBULATORY_CARE_PROVIDER_SITE_OTHER)
Admission: RE | Admit: 2019-04-08 | Discharge: 2019-04-08 | Disposition: A | Discharge status: Home / Self Care | Payer: Self-pay | Source: Ambulatory Visit | Attending: Cardiology | Admitting: Cardiology

## 2019-04-08 DIAGNOSIS — R0602 Shortness of breath: Secondary | ICD-10-CM

## 2019-05-09 DIAGNOSIS — L820 Inflamed seborrheic keratosis: Secondary | ICD-10-CM | POA: Diagnosis not present

## 2019-06-11 DIAGNOSIS — M25552 Pain in left hip: Secondary | ICD-10-CM | POA: Diagnosis not present

## 2019-06-11 DIAGNOSIS — M545 Low back pain: Secondary | ICD-10-CM | POA: Diagnosis not present

## 2019-06-11 DIAGNOSIS — M2560 Stiffness of unspecified joint, not elsewhere classified: Secondary | ICD-10-CM | POA: Diagnosis not present

## 2019-06-11 DIAGNOSIS — M791 Myalgia, unspecified site: Secondary | ICD-10-CM | POA: Diagnosis not present

## 2019-06-14 DIAGNOSIS — M791 Myalgia, unspecified site: Secondary | ICD-10-CM | POA: Diagnosis not present

## 2019-06-14 DIAGNOSIS — M2560 Stiffness of unspecified joint, not elsewhere classified: Secondary | ICD-10-CM | POA: Diagnosis not present

## 2019-06-14 DIAGNOSIS — M545 Low back pain: Secondary | ICD-10-CM | POA: Diagnosis not present

## 2019-06-14 DIAGNOSIS — M25552 Pain in left hip: Secondary | ICD-10-CM | POA: Diagnosis not present

## 2019-06-18 DIAGNOSIS — M545 Low back pain: Secondary | ICD-10-CM | POA: Diagnosis not present

## 2019-06-18 DIAGNOSIS — M2560 Stiffness of unspecified joint, not elsewhere classified: Secondary | ICD-10-CM | POA: Diagnosis not present

## 2019-06-18 DIAGNOSIS — M25552 Pain in left hip: Secondary | ICD-10-CM | POA: Diagnosis not present

## 2019-06-18 DIAGNOSIS — M791 Myalgia, unspecified site: Secondary | ICD-10-CM | POA: Diagnosis not present

## 2019-06-21 DIAGNOSIS — M545 Low back pain: Secondary | ICD-10-CM | POA: Diagnosis not present

## 2019-06-21 DIAGNOSIS — M25552 Pain in left hip: Secondary | ICD-10-CM | POA: Diagnosis not present

## 2019-06-21 DIAGNOSIS — M791 Myalgia, unspecified site: Secondary | ICD-10-CM | POA: Diagnosis not present

## 2019-06-21 DIAGNOSIS — M2560 Stiffness of unspecified joint, not elsewhere classified: Secondary | ICD-10-CM | POA: Diagnosis not present

## 2019-06-24 DIAGNOSIS — M2560 Stiffness of unspecified joint, not elsewhere classified: Secondary | ICD-10-CM | POA: Diagnosis not present

## 2019-06-24 DIAGNOSIS — M791 Myalgia, unspecified site: Secondary | ICD-10-CM | POA: Diagnosis not present

## 2019-06-24 DIAGNOSIS — M545 Low back pain: Secondary | ICD-10-CM | POA: Diagnosis not present

## 2019-06-24 DIAGNOSIS — M25552 Pain in left hip: Secondary | ICD-10-CM | POA: Diagnosis not present

## 2019-06-27 DIAGNOSIS — M2560 Stiffness of unspecified joint, not elsewhere classified: Secondary | ICD-10-CM | POA: Diagnosis not present

## 2019-06-27 DIAGNOSIS — M25552 Pain in left hip: Secondary | ICD-10-CM | POA: Diagnosis not present

## 2019-06-27 DIAGNOSIS — M791 Myalgia, unspecified site: Secondary | ICD-10-CM | POA: Diagnosis not present

## 2019-06-27 DIAGNOSIS — M545 Low back pain: Secondary | ICD-10-CM | POA: Diagnosis not present

## 2019-06-29 DIAGNOSIS — Z20828 Contact with and (suspected) exposure to other viral communicable diseases: Secondary | ICD-10-CM | POA: Diagnosis not present

## 2019-07-01 DIAGNOSIS — M791 Myalgia, unspecified site: Secondary | ICD-10-CM | POA: Diagnosis not present

## 2019-07-01 DIAGNOSIS — M25552 Pain in left hip: Secondary | ICD-10-CM | POA: Diagnosis not present

## 2019-07-01 DIAGNOSIS — M545 Low back pain: Secondary | ICD-10-CM | POA: Diagnosis not present

## 2019-07-01 DIAGNOSIS — M2560 Stiffness of unspecified joint, not elsewhere classified: Secondary | ICD-10-CM | POA: Diagnosis not present

## 2019-07-04 DIAGNOSIS — M791 Myalgia, unspecified site: Secondary | ICD-10-CM | POA: Diagnosis not present

## 2019-07-04 DIAGNOSIS — M545 Low back pain: Secondary | ICD-10-CM | POA: Diagnosis not present

## 2019-07-04 DIAGNOSIS — M2560 Stiffness of unspecified joint, not elsewhere classified: Secondary | ICD-10-CM | POA: Diagnosis not present

## 2019-07-04 DIAGNOSIS — M25552 Pain in left hip: Secondary | ICD-10-CM | POA: Diagnosis not present

## 2019-07-09 DIAGNOSIS — M25552 Pain in left hip: Secondary | ICD-10-CM | POA: Diagnosis not present

## 2019-07-09 DIAGNOSIS — M791 Myalgia, unspecified site: Secondary | ICD-10-CM | POA: Diagnosis not present

## 2019-07-09 DIAGNOSIS — M545 Low back pain: Secondary | ICD-10-CM | POA: Diagnosis not present

## 2019-07-09 DIAGNOSIS — L820 Inflamed seborrheic keratosis: Secondary | ICD-10-CM | POA: Diagnosis not present

## 2019-07-09 DIAGNOSIS — M2560 Stiffness of unspecified joint, not elsewhere classified: Secondary | ICD-10-CM | POA: Diagnosis not present

## 2019-07-11 DIAGNOSIS — M25552 Pain in left hip: Secondary | ICD-10-CM | POA: Diagnosis not present

## 2019-07-11 DIAGNOSIS — M2560 Stiffness of unspecified joint, not elsewhere classified: Secondary | ICD-10-CM | POA: Diagnosis not present

## 2019-07-11 DIAGNOSIS — M791 Myalgia, unspecified site: Secondary | ICD-10-CM | POA: Diagnosis not present

## 2019-07-11 DIAGNOSIS — M545 Low back pain: Secondary | ICD-10-CM | POA: Diagnosis not present

## 2019-07-15 DIAGNOSIS — M25552 Pain in left hip: Secondary | ICD-10-CM | POA: Diagnosis not present

## 2019-07-15 DIAGNOSIS — M545 Low back pain: Secondary | ICD-10-CM | POA: Diagnosis not present

## 2019-07-15 DIAGNOSIS — M791 Myalgia, unspecified site: Secondary | ICD-10-CM | POA: Diagnosis not present

## 2019-07-15 DIAGNOSIS — M2560 Stiffness of unspecified joint, not elsewhere classified: Secondary | ICD-10-CM | POA: Diagnosis not present

## 2019-07-18 DIAGNOSIS — M2560 Stiffness of unspecified joint, not elsewhere classified: Secondary | ICD-10-CM | POA: Diagnosis not present

## 2019-07-18 DIAGNOSIS — M545 Low back pain: Secondary | ICD-10-CM | POA: Diagnosis not present

## 2019-07-18 DIAGNOSIS — M791 Myalgia, unspecified site: Secondary | ICD-10-CM | POA: Diagnosis not present

## 2019-07-18 DIAGNOSIS — M25552 Pain in left hip: Secondary | ICD-10-CM | POA: Diagnosis not present

## 2019-07-24 DIAGNOSIS — M2560 Stiffness of unspecified joint, not elsewhere classified: Secondary | ICD-10-CM | POA: Diagnosis not present

## 2019-07-24 DIAGNOSIS — M25552 Pain in left hip: Secondary | ICD-10-CM | POA: Diagnosis not present

## 2019-07-24 DIAGNOSIS — M791 Myalgia, unspecified site: Secondary | ICD-10-CM | POA: Diagnosis not present

## 2019-07-24 DIAGNOSIS — M545 Low back pain: Secondary | ICD-10-CM | POA: Diagnosis not present

## 2019-07-29 DIAGNOSIS — M791 Myalgia, unspecified site: Secondary | ICD-10-CM | POA: Diagnosis not present

## 2019-07-29 DIAGNOSIS — M25552 Pain in left hip: Secondary | ICD-10-CM | POA: Diagnosis not present

## 2019-07-29 DIAGNOSIS — M2560 Stiffness of unspecified joint, not elsewhere classified: Secondary | ICD-10-CM | POA: Diagnosis not present

## 2019-07-29 DIAGNOSIS — M545 Low back pain: Secondary | ICD-10-CM | POA: Diagnosis not present

## 2019-08-01 DIAGNOSIS — M545 Low back pain: Secondary | ICD-10-CM | POA: Diagnosis not present

## 2019-08-01 DIAGNOSIS — M791 Myalgia, unspecified site: Secondary | ICD-10-CM | POA: Diagnosis not present

## 2019-08-01 DIAGNOSIS — M25552 Pain in left hip: Secondary | ICD-10-CM | POA: Diagnosis not present

## 2019-08-01 DIAGNOSIS — M2560 Stiffness of unspecified joint, not elsewhere classified: Secondary | ICD-10-CM | POA: Diagnosis not present

## 2019-08-07 DIAGNOSIS — M791 Myalgia, unspecified site: Secondary | ICD-10-CM | POA: Diagnosis not present

## 2019-08-07 DIAGNOSIS — M25552 Pain in left hip: Secondary | ICD-10-CM | POA: Diagnosis not present

## 2019-08-07 DIAGNOSIS — M545 Low back pain: Secondary | ICD-10-CM | POA: Diagnosis not present

## 2019-08-07 DIAGNOSIS — M2560 Stiffness of unspecified joint, not elsewhere classified: Secondary | ICD-10-CM | POA: Diagnosis not present

## 2019-08-22 DIAGNOSIS — M545 Low back pain: Secondary | ICD-10-CM | POA: Diagnosis not present

## 2019-08-22 DIAGNOSIS — M791 Myalgia, unspecified site: Secondary | ICD-10-CM | POA: Diagnosis not present

## 2019-08-22 DIAGNOSIS — M25552 Pain in left hip: Secondary | ICD-10-CM | POA: Diagnosis not present

## 2019-08-22 DIAGNOSIS — M2560 Stiffness of unspecified joint, not elsewhere classified: Secondary | ICD-10-CM | POA: Diagnosis not present

## 2019-09-19 IMAGING — CT CT HEART SCORING
2 series · 16 of 20 positions shown, 18 images · non-contrast
Comparison: None.
COMPARISON: None.

Addendum:
EXAM:
OVER-READ INTERPRETATION  CT CHEST

The following report is an over-read performed by radiologist Dr.
Anita Hector [REDACTED] on 04/08/2019. This over-read
does not include interpretation of cardiac or coronary anatomy or
pathology. The coronary calcium score interpretation by the
cardiologist is attached.
CLINICAL DATA: Risk stratification
Coronary Calcium Score
TECHNIQUE: The patient was scanned on a Siemens Somatom 64 slice scanner. Axial
non-contrast 3 mm slices were carried out through the heart. The
data set was analyzed on a dedicated work station and scored using
the Agatson method.

[Series 2: casc 3.0 i36f 2 bestdiast 68 % · axial · 0.40mm/px · z∈[-254,-149]mm · 8 of 47 slices shown, 10 images]
[im 6/47  vessel]
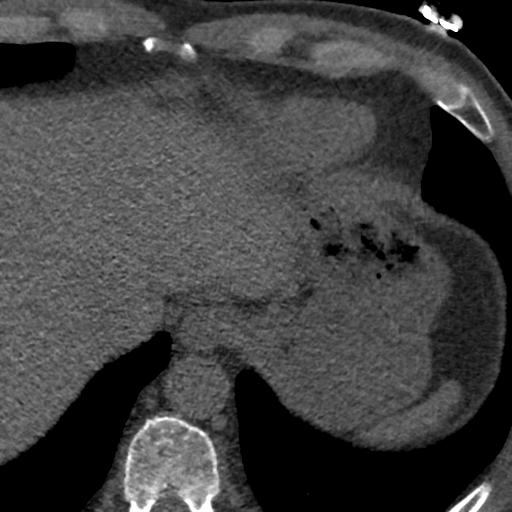
[im 6/47  lung]
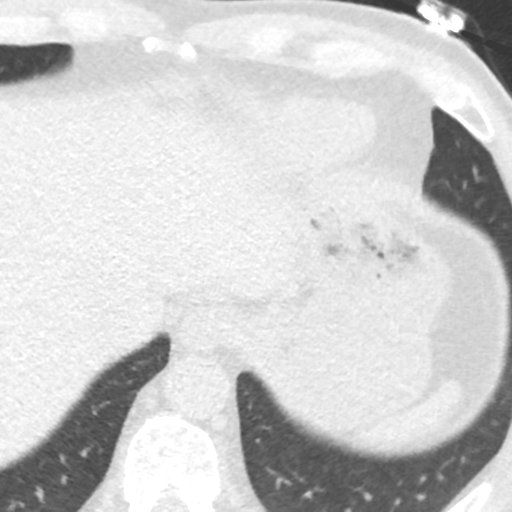
[im 11/47  vessel]
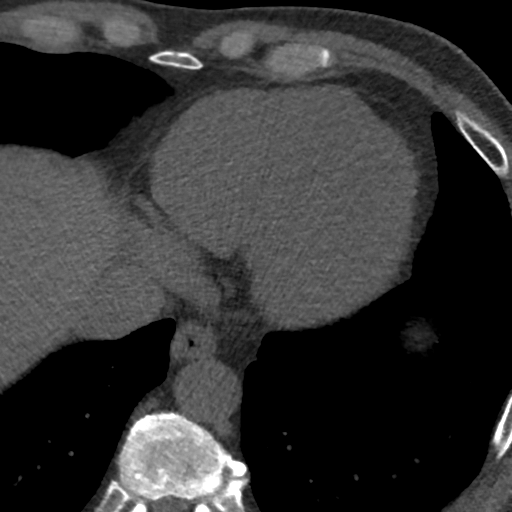
[im 16/47  vessel]
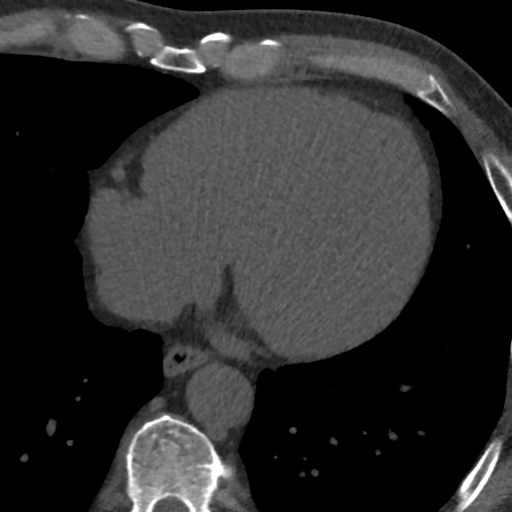
[im 21/47  vessel]
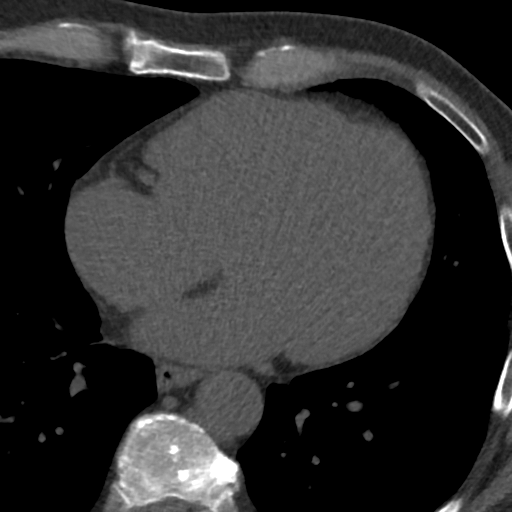
[im 26/47  vessel]
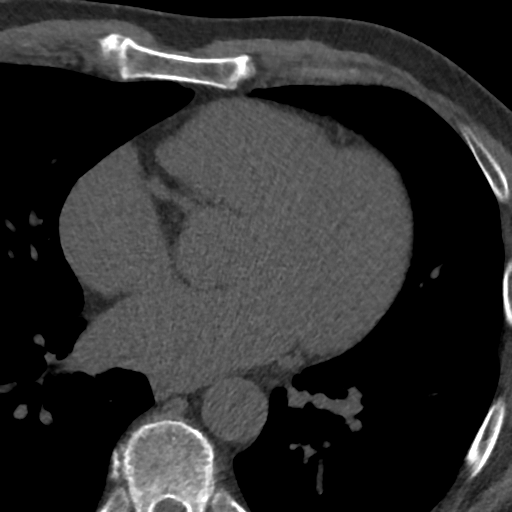
[im 26/47  lung]
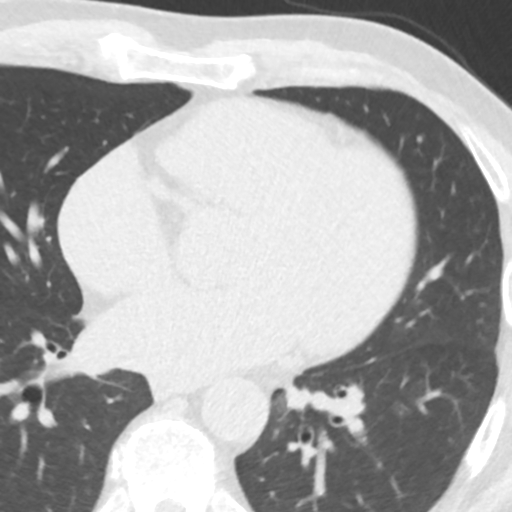
[im 31/47  vessel]
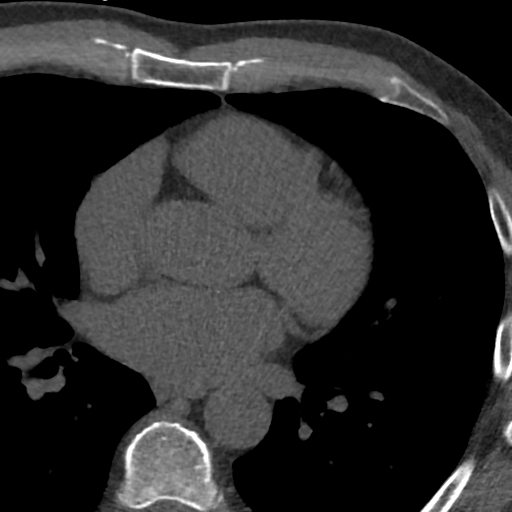
[im 36/47  vessel]
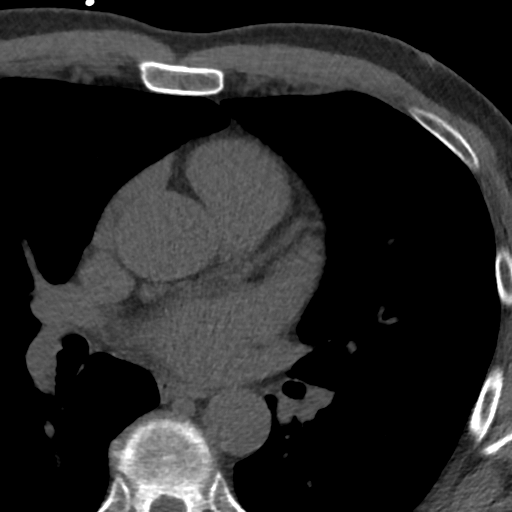
[im 41/47  vessel]
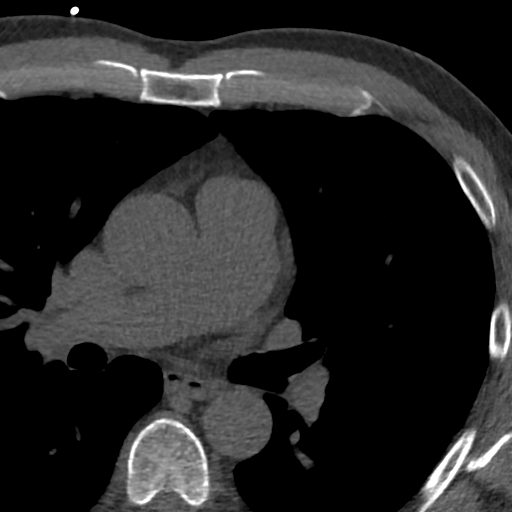

[Series 4: lung st 68 % · axial · 0.68mm/px · z∈[-254,-149]mm · 8 of 47 slices shown]
[im 6/47  lung]
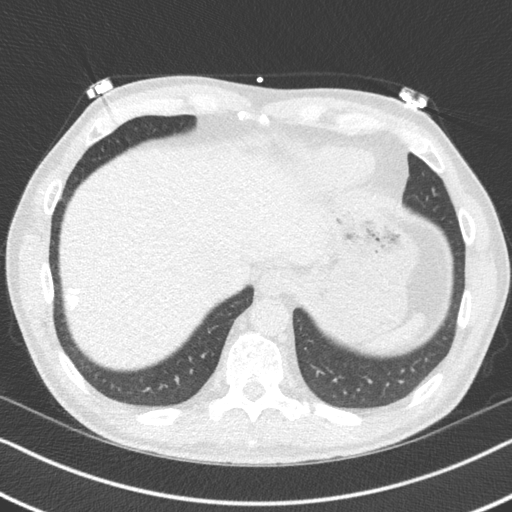
[im 11/47  lung]
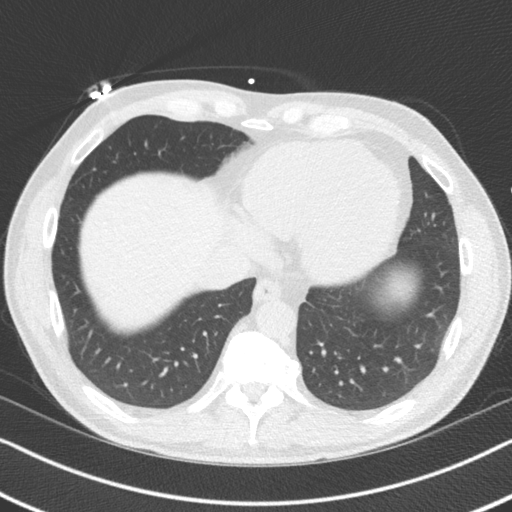
[im 16/47  lung]
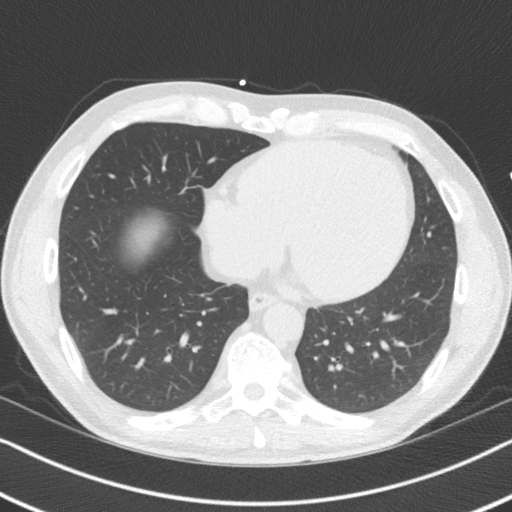
[im 21/47  lung]
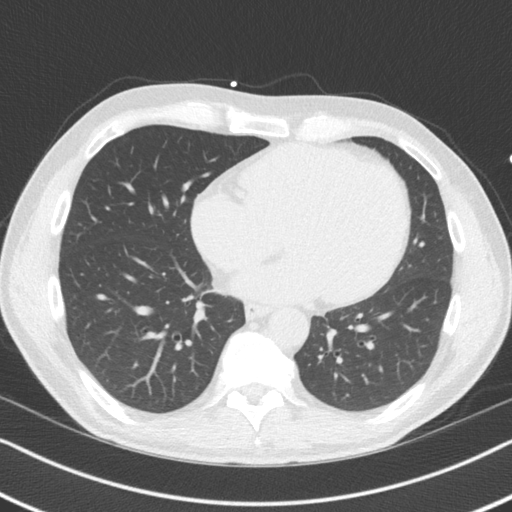
[im 26/47  lung]
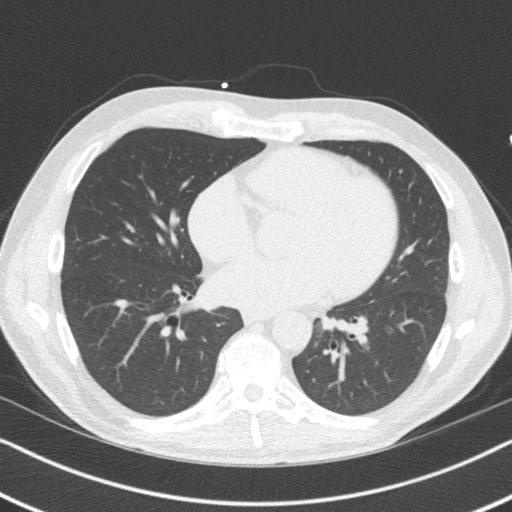
[im 31/47  lung]
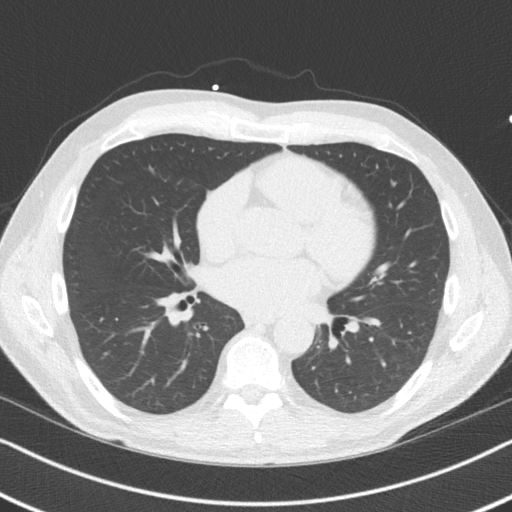
[im 36/47  lung]
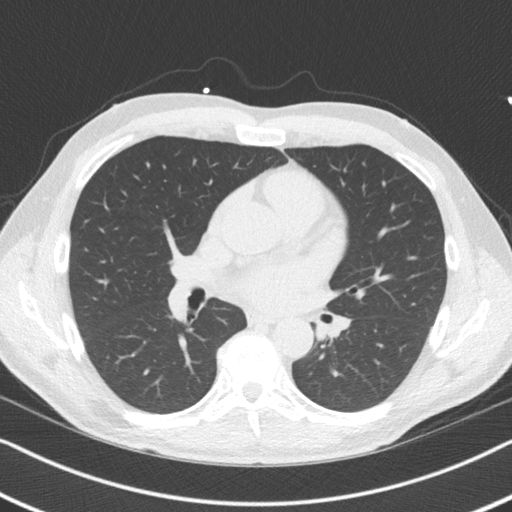
[im 41/47  lung]
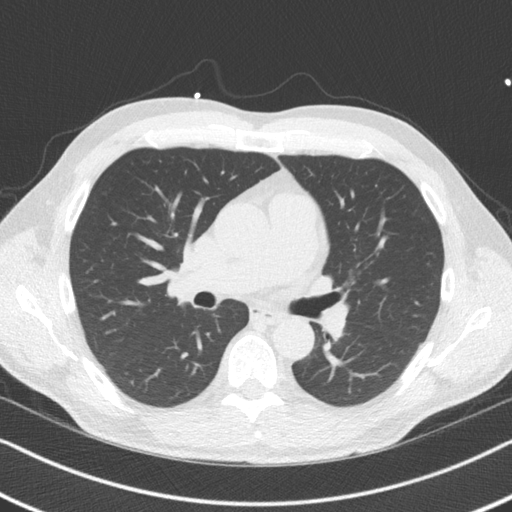

[16 of 20 positions shown; findings below may reference images not displayed]

FINDINGS: Vascular: Heart is normal size.  Aorta is normal caliber.

Mediastinum/Nodes: No adenopathy in the lower mediastinum or hila.

Lungs/Pleura: Visualized lungs clear.  No effusions.

Upper Abdomen: Imaging into the upper abdomen shows no acute
findings.

Musculoskeletal: Chest wall soft tissues are unremarkable. No acute
bony abnormality.
IMPRESSION: No acute or significant extracardiac abnormality.
FINDINGS: Non-cardiac: See separate report from [REDACTED].

Ascending aorta: Normal diameter 3.7 cm

Pericardium: Normal

Coronary arteries: No calcium noted
IMPRESSION: Coronary calcium score of 0.

Chitanga Lukaezi

*** End of Addendum ***
EXAM:
OVER-READ INTERPRETATION  CT CHEST

The following report is an over-read performed by radiologist Dr.
Anita Hector [REDACTED] on 04/08/2019. This over-read
does not include interpretation of cardiac or coronary anatomy or
pathology. The coronary calcium score interpretation by the
cardiologist is attached.
FINDINGS: Vascular: Heart is normal size.  Aorta is normal caliber.

Mediastinum/Nodes: No adenopathy in the lower mediastinum or hila.

Lungs/Pleura: Visualized lungs clear.  No effusions.

Upper Abdomen: Imaging into the upper abdomen shows no acute
findings.

Musculoskeletal: Chest wall soft tissues are unremarkable. No acute
bony abnormality.
IMPRESSION: No acute or significant extracardiac abnormality.

## 2019-12-24 DIAGNOSIS — M545 Low back pain: Secondary | ICD-10-CM | POA: Diagnosis not present

## 2019-12-31 ENCOUNTER — Telehealth: Payer: Self-pay | Admitting: Cardiology

## 2019-12-31 DIAGNOSIS — E785 Hyperlipidemia, unspecified: Secondary | ICD-10-CM

## 2019-12-31 NOTE — Telephone Encounter (Signed)
Patient reports Dr. Antoine Poche mentioned wanting to follow up on his cholesterol in about 6 months. Patient wants to know if Dr. Antoine Poche would like to repeat lab work before he comes back in for an office visit. Will route to Dr. Antoine Poche for review.

## 2019-12-31 NOTE — Telephone Encounter (Signed)
No appointment scheduled.   Routed to primary nurse to advise if labs are needed

## 2019-12-31 NOTE — Telephone Encounter (Signed)
Patient wanted to know if he needs to have labs done prior to his next appt with Dr. Antoine Poche. Please advise

## 2020-01-03 NOTE — Telephone Encounter (Signed)
Patient called back asking about labs. Per Dorathy Daft, labs can be done the same day as the appointment. The patient would prefer to have labs drawn prior to his appointment. He would like Kayla to put in orders and let him know when the lab orders are in. Then he will schedule his follow-up with Dr. Antoine Poche

## 2020-01-06 NOTE — Telephone Encounter (Signed)
Left message for patient to call back. Lab orders have been entered into our system for completion. Patient may come by any morning for a fasting lipid and liver profile. Patient may schedule his follow up appointment with Dr. Antoine Poche as well. Unable to leave detailed VM due to no DPR on file.

## 2020-01-06 NOTE — Telephone Encounter (Signed)
Yes please fasting lipid and liver.

## 2020-01-08 DIAGNOSIS — E785 Hyperlipidemia, unspecified: Secondary | ICD-10-CM | POA: Diagnosis not present

## 2020-01-08 LAB — LIPID PANEL
Chol/HDL Ratio: 3.3 ratio (ref 0.0–5.0)
Cholesterol, Total: 262 mg/dL — ABNORMAL HIGH (ref 100–199)
HDL: 80 mg/dL (ref >39)
LDL Chol Calc (NIH): 170 mg/dL — ABNORMAL HIGH (ref 0–99)
Triglycerides: 74 mg/dL (ref 0–149)
VLDL Cholesterol Cal: 12 mg/dL (ref 5–40)

## 2020-01-08 LAB — HEPATIC FUNCTION PANEL
ALT: 28 IU/L (ref 0–44)
AST: 23 IU/L (ref 0–40)
Albumin: 4.6 g/dL (ref 3.8–4.9)
Alkaline Phosphatase: 61 IU/L (ref 39–117)
Bilirubin Total: 0.5 mg/dL (ref 0.0–1.2)
Bilirubin, Direct: 0.15 mg/dL (ref 0.00–0.40)
Total Protein: 7.2 g/dL (ref 6.0–8.5)

## 2020-01-09 NOTE — Progress Notes (Signed)
Cardiology Office Note   Date:  01/10/2020   ID:  Bruce [name], DOB 12-13-1960, MRN [id number]  PCP:  Lawerance Cruel, MD  Cardiologist:   Minus Breeding, MD Referring:  Lawerance Cruel, MD  No chief complaint on file.     History of Present Illness: Bruce [name] is a 59 y.o. male who was referred by Lawerance Cruel, MD for evaluation of SOB.  I sent him for a coronary calcium score which was zero.    Since I last saw him he has been feeling well.  Has had some tingling in his right hand and his right shoulder blade.  He is not been having any shortness of breath or chest discomfort.  He remains active.  He denies palpitations, presyncope or syncope.  Has had no weight gain or edema.   Past Medical History:  Diagnosis Date  . GERD (gastroesophageal reflux disease)   . Hyperlipidemia     History reviewed. No pertinent surgical history.   Current Outpatient Medications  Medication Sig Dispense Refill  . Multiple Vitamin (MULTIVITAMIN) tablet Take 1 tablet by mouth daily.    . Omega-3 Fatty Acids (FISH OIL OMEGA-3 PO) Take 1 capsule by mouth daily.    Marland Kitchen omeprazole (PRILOSEC) 20 MG capsule Take 20 mg by mouth daily.    . Plant Sterols and Stanols (CHOLESTOFF PO) Take 1 tablet by mouth daily.     No current facility-administered medications for this visit.    Allergies:   Patient has no known allergies.    ROS:  Please see the history of present illness.   Otherwise, review of systems are positive for none.   All other systems are reviewed and negative.    PHYSICAL EXAM: VS:  BP 138/70 (BP Location: Left Arm, Patient Position: Sitting, Cuff Size: Normal)   Pulse 62   Temp (!) 97.5 F (36.4 C)   Ht 6' (1.829 m)   Wt 179 lb (81.2 kg)   BMI 24.28 kg/m  , BMI Body mass index is 24.28 kg/m. GENERAL:  Well appearing NECK:  No jugular venous distention, waveform within normal limits, carotid upstroke [name] and symmetric, no bruits, no thyromegaly LUNGS:   Clear to auscultation bilaterally CHEST:  Unremarkable HEART:  PMI not displaced or sustained,S1 and S2 within normal limits, no S3, no S4, no clicks, no rubs, no murmurs ABD:  Flat, positive bowel sounds normal in frequency in pitch, no bruits, no rebound, no guarding, no midline pulsatile mass, no hepatomegaly, no splenomegaly EXT:  2 plus pulses throughout, no edema, no cyanosis no clubbing   EKG:  EKG is not ordered today.   Recent Labs: 01/08/2020: ALT 28    Lipid Panel    Component Value Date/Time   CHOL 262 (H) 01/08/2020 0944   TRIG 74 01/08/2020 0944   HDL 80 01/08/2020 0944   CHOLHDL 3.3 01/08/2020 0944   LDLCALC 170 (H) 01/08/2020 0944      Wt Readings from Last 3 Encounters:  01/10/20 179 lb (81.2 kg)  02/28/19 185 lb 12.8 oz (84.3 kg)  09/05/18 180 lb (81.6 kg)      Other studies Reviewed: Additional studies/ records that were reviewed today include: Labs. Review of the above records demonstrates:  Please see elsewhere in the note.     ASSESSMENT AND PLAN:   HTN:   His BP is slightly elevated.  I gave him a target of 130/85.  It is not consistently at that I  would consider starting chlorthalidone.  He will check a blood pressure diary and let me know.   DYSLIPIDEMIA:  LDL was 170 with a total of 262 with an HDL of 80.  With a 0 calcium score he has MESA score is 2.1% which would not necessitate a statin and less his LDL goes up even further into the 180 or 190 range.  He and I talked about this and we talked about diet.  No change in therapy.   Covid education: He has had dose one of the vaccine.  Current medicines are reviewed at length with the patient today.  The patient does not have concerns regarding medicines.  The following changes have been made:  None  Labs/ tests ordered today include: None  No orders of the defined types were placed in this encounter.    Disposition:   FU with me one year.    Signed, Rollene Rotunda, MD  01/10/2020  8:56 AM    [facility]

## 2020-01-10 ENCOUNTER — Ambulatory Visit: Payer: Federal, State, Local not specified - PPO | Admitting: Cardiology

## 2020-01-10 ENCOUNTER — Encounter: Payer: Self-pay | Admitting: Cardiology

## 2020-01-10 ENCOUNTER — Other Ambulatory Visit: Payer: Self-pay

## 2020-01-10 VITALS — BP 138/70 | HR 62 | Temp 97.5°F | Ht 72.0 in | Wt 179.0 lb

## 2020-01-10 DIAGNOSIS — E785 Hyperlipidemia, unspecified: Secondary | ICD-10-CM | POA: Diagnosis not present

## 2020-01-10 DIAGNOSIS — Z7189 Other specified counseling: Secondary | ICD-10-CM | POA: Diagnosis not present

## 2020-01-10 DIAGNOSIS — I10 Essential (primary) hypertension: Secondary | ICD-10-CM | POA: Diagnosis not present

## 2020-01-10 NOTE — Patient Instructions (Signed)
Medication Instructions:  NO CHANGES *If you need a refill on your cardiac medications before your next appointment, please call your pharmacy*  Lab Work: NONE ORDERED THIS VISIT  Testing/Procedures: NONE ORDERED THIS VISIT  Follow-Up: At [facility], you and your health needs are our priority.  As part of our continuing mission to provide you with exceptional heart care, we have created designated Provider Care Teams.  These Care Teams include your primary Cardiologist (physician) and Advanced Practice Providers (APPs -  Physician Assistants and Nurse Practitioners) who all work together to provide you with the care you need, when you need it.  Your next appointment:   12 month(s)  The format for your next appointment:   In Person  Provider:   Rollene Rotunda, MD

## 2020-02-07 DIAGNOSIS — Z0000 Encounter for general adult medical examination without abnormal findings: Secondary | ICD-10-CM | POA: Diagnosis not present

## 2020-02-07 DIAGNOSIS — Z23 Encounter for immunization: Secondary | ICD-10-CM | POA: Diagnosis not present

## 2020-02-07 DIAGNOSIS — Z125 Encounter for screening for malignant neoplasm of prostate: Secondary | ICD-10-CM | POA: Diagnosis not present

## 2020-02-17 DIAGNOSIS — S299XXA Unspecified injury of thorax, initial encounter: Secondary | ICD-10-CM | POA: Diagnosis not present

## 2020-03-10 DIAGNOSIS — H8112 Benign paroxysmal vertigo, left ear: Secondary | ICD-10-CM | POA: Diagnosis not present

## 2020-06-29 DIAGNOSIS — M545 Low back pain, unspecified: Secondary | ICD-10-CM | POA: Diagnosis not present

## 2020-11-06 DIAGNOSIS — M545 Low back pain, unspecified: Secondary | ICD-10-CM | POA: Diagnosis not present

## 2020-11-06 DIAGNOSIS — S39012A Strain of muscle, fascia and tendon of lower back, initial encounter: Secondary | ICD-10-CM | POA: Diagnosis not present

## 2020-12-11 DIAGNOSIS — H43812 Vitreous degeneration, left eye: Secondary | ICD-10-CM | POA: Diagnosis not present

## 2021-01-21 DIAGNOSIS — R0602 Shortness of breath: Secondary | ICD-10-CM | POA: Insufficient documentation

## 2021-01-21 NOTE — Progress Notes (Signed)
Cardiology Office Note   Date:  01/22/2021   ID:  Bruce [name], DOB October 16, 1960, MRN [id number]  PCP:  Daisy Floro, MD  Cardiologist:   Rollene Rotunda, MD Referring:  Daisy Floro, MD   Chief Complaint  Patient presents with  . Hypertension      History of Present Illness: Bruce [name] is a 60 y.o. male who was referred by Daisy Floro, MD for evaluation of SOB.  I sent him for a coronary calcium score which was zero.    Since I last saw him he has done well.  The patient denies any new symptoms such as chest discomfort, neck or arm discomfort. There has been no new shortness of breath, PND or orthopnea. There have been no reported palpitations, presyncope or syncope.  He still plays hockey.  I did check his blood pressures last year when he had a diary and at that time I did treat him but they are consistently above 130 and above the mid 80s diastolic.  He said a little higher recent office visit.  Past Medical History:  Diagnosis Date  . GERD (gastroesophageal reflux disease)   . Hyperlipidemia     History reviewed. No pertinent surgical history.   Current Outpatient Medications  Medication Sig Dispense Refill  . chlorthalidone (HYGROTON) 25 MG tablet Take 1/2 tablet daily 45 tablet 3  . Multiple Vitamin (MULTIVITAMIN) tablet Take 1 tablet by mouth daily.    . Omega-3 Fatty Acids (FISH OIL OMEGA-3 PO) Take 1 capsule by mouth daily.    Marland Kitchen omeprazole (PRILOSEC) 20 MG capsule Take 20 mg by mouth daily.    . Plant Sterols and Stanols (CHOLESTOFF PO) Take 1 tablet by mouth daily.     No current facility-administered medications for this visit.    Allergies:   Patient has no known allergies.    ROS:  Please see the history of present illness.   Otherwise, review of systems are positive for none.   All other systems are reviewed and negative.    PHYSICAL EXAM: VS:  BP 132/86   Pulse (!) 44   Ht 6' (1.829 m)   Wt 188 lb 3.2 oz (85.4 kg)   BMI  25.52 kg/m  , BMI Body mass index is 25.52 kg/m. GENERAL:  Well appearing NECK:  No jugular venous distention, waveform within normal limits, carotid upstroke brisk and symmetric, no bruits, no thyromegaly LUNGS:  Clear to auscultation bilaterally CHEST:  Unremarkable HEART:  PMI not displaced or sustained,S1 and S2 within normal limits, no S3, no S4, no clicks, no rubs, no murmurs ABD:  Flat, positive bowel sounds normal in frequency in pitch, no bruits, no rebound, no guarding, no midline pulsatile mass, no hepatomegaly, no splenomegaly EXT:  2 plus pulses throughout, no edema, no cyanosis no clubbing   EKG:  EKG is ordered today. Sinus bradycardia, rate 44, axis within normal limits, intervals within normal limits, no acute ST-T wave changes.  Recent Labs: No results found for requested labs within last 8760 hours.    Lipid Panel    Component Value Date/Time   CHOL 262 (H) 01/08/2020 0944   TRIG 74 01/08/2020 0944   HDL 80 01/08/2020 0944   CHOLHDL 3.3 01/08/2020 0944   LDLCALC 170 (H) 01/08/2020 0944      Wt Readings from Last 3 Encounters:  01/22/21 188 lb 3.2 oz (85.4 kg)  01/10/20 179 lb (81.2 kg)  02/28/19 185 lb 12.8 oz (  84.3 kg)      Other studies Reviewed: Additional studies/ records that were reviewed today include: None. Review of the above records demonstrates:  NA   ASSESSMENT AND PLAN:   HTN:   His BP is slightly elevated.  I am going to add chlorthalidone 12.5 mg daily to his regimen and he will keep a blood pressure diary for me.   DYSLIPIDEMIA:   With a calcium score of 0 his Framingham risk and Mesa score was very low.  We talked about plant-based diet.   Current medicines are reviewed at length with the patient today.  The patient does not have concerns regarding medicines.  The following changes have been made: As above  Labs/ tests ordered today include: None  No orders of the defined types were placed in this  encounter.    Disposition:   FU with me as needed   Signed, Rollene Rotunda, MD  01/22/2021 8:09 AM    [facility]

## 2021-01-22 ENCOUNTER — Ambulatory Visit: Payer: Federal, State, Local not specified - PPO | Admitting: Cardiology

## 2021-01-22 ENCOUNTER — Encounter: Payer: Self-pay | Admitting: Cardiology

## 2021-01-22 ENCOUNTER — Other Ambulatory Visit: Payer: Self-pay

## 2021-01-22 VITALS — BP 132/86 | HR 44 | Ht 72.0 in | Wt 188.2 lb

## 2021-01-22 DIAGNOSIS — R0602 Shortness of breath: Secondary | ICD-10-CM

## 2021-01-22 DIAGNOSIS — E785 Hyperlipidemia, unspecified: Secondary | ICD-10-CM | POA: Diagnosis not present

## 2021-01-22 DIAGNOSIS — I10 Essential (primary) hypertension: Secondary | ICD-10-CM | POA: Diagnosis not present

## 2021-01-22 MED ORDER — CHLORTHALIDONE 25 MG PO TABS: ORAL_TABLET | ORAL | 3 refills | Status: DC

## 2021-01-22 NOTE — Patient Instructions (Signed)
Medication Instructions:  Start Chlorthalidone 25 mg 1/2 tablet daily  Continue all other medications    Lab Work: None ordered   Testing/Procedures: None ordered   Follow-Up: At [facility], you and your health needs are our priority.  As part of our continuing mission to provide you with exceptional heart care, we have created designated Provider Care Teams.  These Care Teams include your primary Cardiologist (physician) and Advanced Practice Providers (APPs -  Physician Assistants and Nurse Practitioners) who all work together to provide you with the care you need, when you need it.  We recommend signing up for the patient portal called "MyChart".  Sign up information is provided on this After Visit Summary.  MyChart is used to connect with patients for Virtual Visits (Telemedicine).  Patients are able to view lab/test results, encounter notes, upcoming appointments, etc.  Non-urgent messages can be sent to your provider as well.   To learn more about what you can do with MyChart, go to [email or web address].    Your next appointment:  As Needed   The format for your next appointment: Office   Provider:  Dr.Hochrein

## 2021-01-25 NOTE — Addendum Note (Signed)
Addended by: Chana Bode on: 01/25/2021 02:49 PM   Modules accepted: Orders

## 2021-02-08 DIAGNOSIS — E782 Mixed hyperlipidemia: Secondary | ICD-10-CM | POA: Diagnosis not present

## 2021-02-08 DIAGNOSIS — Z0000 Encounter for general adult medical examination without abnormal findings: Secondary | ICD-10-CM | POA: Diagnosis not present

## 2021-02-08 DIAGNOSIS — Z125 Encounter for screening for malignant neoplasm of prostate: Secondary | ICD-10-CM | POA: Diagnosis not present

## 2021-02-11 DIAGNOSIS — Z0000 Encounter for general adult medical examination without abnormal findings: Secondary | ICD-10-CM | POA: Diagnosis not present

## 2021-04-07 DIAGNOSIS — M545 Low back pain, unspecified: Secondary | ICD-10-CM | POA: Diagnosis not present

## 2021-10-21 ENCOUNTER — Other Ambulatory Visit: Payer: Self-pay | Admitting: Cardiology

## 2022-01-13 DIAGNOSIS — H40013 Open angle with borderline findings, low risk, bilateral: Secondary | ICD-10-CM | POA: Diagnosis not present

## 2022-02-22 DIAGNOSIS — Z0000 Encounter for general adult medical examination without abnormal findings: Secondary | ICD-10-CM | POA: Diagnosis not present

## 2022-02-22 DIAGNOSIS — Z13220 Encounter for screening for lipoid disorders: Secondary | ICD-10-CM | POA: Diagnosis not present

## 2022-02-22 DIAGNOSIS — Z125 Encounter for screening for malignant neoplasm of prostate: Secondary | ICD-10-CM | POA: Diagnosis not present

## 2022-03-03 DIAGNOSIS — Z0000 Encounter for general adult medical examination without abnormal findings: Secondary | ICD-10-CM | POA: Diagnosis not present

## 2022-05-02 DIAGNOSIS — S39012A Strain of muscle, fascia and tendon of lower back, initial encounter: Secondary | ICD-10-CM | POA: Diagnosis not present

## 2022-07-28 DIAGNOSIS — Z09 Encounter for follow-up examination after completed treatment for conditions other than malignant neoplasm: Secondary | ICD-10-CM | POA: Diagnosis not present

## 2022-07-28 DIAGNOSIS — D123 Benign neoplasm of transverse colon: Secondary | ICD-10-CM | POA: Diagnosis not present

## 2022-07-28 DIAGNOSIS — Z86010 Personal history of colonic polyps: Secondary | ICD-10-CM | POA: Diagnosis not present

## 2022-07-28 DIAGNOSIS — K5730 Diverticulosis of large intestine without perforation or abscess without bleeding: Secondary | ICD-10-CM | POA: Diagnosis not present

## 2022-07-28 DIAGNOSIS — D122 Benign neoplasm of ascending colon: Secondary | ICD-10-CM | POA: Diagnosis not present

## 2023-01-10 DIAGNOSIS — K080 Exfoliation of teeth due to systemic causes: Secondary | ICD-10-CM | POA: Diagnosis not present

## 2023-02-28 DIAGNOSIS — Z0000 Encounter for general adult medical examination without abnormal findings: Secondary | ICD-10-CM | POA: Diagnosis not present

## 2023-02-28 DIAGNOSIS — R7301 Impaired fasting glucose: Secondary | ICD-10-CM | POA: Diagnosis not present

## 2023-02-28 DIAGNOSIS — Z125 Encounter for screening for malignant neoplasm of prostate: Secondary | ICD-10-CM | POA: Diagnosis not present

## 2023-02-28 DIAGNOSIS — Z13220 Encounter for screening for lipoid disorders: Secondary | ICD-10-CM | POA: Diagnosis not present

## 2023-03-07 DIAGNOSIS — Z0000 Encounter for general adult medical examination without abnormal findings: Secondary | ICD-10-CM | POA: Diagnosis not present

## 2023-04-18 DIAGNOSIS — H40013 Open angle with borderline findings, low risk, bilateral: Secondary | ICD-10-CM | POA: Diagnosis not present

## 2023-06-26 DIAGNOSIS — S39012A Strain of muscle, fascia and tendon of lower back, initial encounter: Secondary | ICD-10-CM | POA: Diagnosis not present

## 2023-07-12 DIAGNOSIS — S39012D Strain of muscle, fascia and tendon of lower back, subsequent encounter: Secondary | ICD-10-CM | POA: Diagnosis not present

## 2023-08-02 DIAGNOSIS — S39012D Strain of muscle, fascia and tendon of lower back, subsequent encounter: Secondary | ICD-10-CM | POA: Diagnosis not present

## 2024-02-06 ENCOUNTER — Other Ambulatory Visit: Payer: Self-pay | Admitting: Medical Genetics

## 2024-02-08 ENCOUNTER — Other Ambulatory Visit (HOSPITAL_COMMUNITY)
Admission: RE | Admit: 2024-02-08 | Discharge: 2024-02-08 | Disposition: A | Discharge status: Home / Self Care | Payer: Self-pay | Source: Ambulatory Visit | Attending: Medical Genetics | Admitting: Medical Genetics

## 2024-02-18 LAB — GENECONNECT MOLECULAR SCREEN: Genetic Analysis Overall Interpretation: NEGATIVE

## 2024-02-29 DIAGNOSIS — S39012A Strain of muscle, fascia and tendon of lower back, initial encounter: Secondary | ICD-10-CM | POA: Diagnosis not present

## 2024-03-21 DIAGNOSIS — Z0000 Encounter for general adult medical examination without abnormal findings: Secondary | ICD-10-CM | POA: Diagnosis not present

## 2024-04-02 DIAGNOSIS — Z0000 Encounter for general adult medical examination without abnormal findings: Secondary | ICD-10-CM | POA: Diagnosis not present

## 2024-04-16 DIAGNOSIS — R0981 Nasal congestion: Secondary | ICD-10-CM | POA: Diagnosis not present

## 2024-04-16 DIAGNOSIS — H9201 Otalgia, right ear: Secondary | ICD-10-CM | POA: Diagnosis not present

## 2024-04-23 DIAGNOSIS — H9201 Otalgia, right ear: Secondary | ICD-10-CM | POA: Diagnosis not present

## 2024-05-21 DIAGNOSIS — H40013 Open angle with borderline findings, low risk, bilateral: Secondary | ICD-10-CM | POA: Diagnosis not present

## 2024-05-27 DIAGNOSIS — M1611 Unilateral primary osteoarthritis, right hip: Secondary | ICD-10-CM | POA: Diagnosis not present

## 2024-05-27 DIAGNOSIS — M1612 Unilateral primary osteoarthritis, left hip: Secondary | ICD-10-CM | POA: Diagnosis not present

## 2024-05-27 DIAGNOSIS — M160 Bilateral primary osteoarthritis of hip: Secondary | ICD-10-CM | POA: Diagnosis not present

## 2024-07-15 DIAGNOSIS — H40013 Open angle with borderline findings, low risk, bilateral: Secondary | ICD-10-CM | POA: Diagnosis not present

## 2024-07-16 DIAGNOSIS — R972 Elevated prostate specific antigen [PSA]: Secondary | ICD-10-CM | POA: Diagnosis not present
